# Patient Record
Sex: Female | Born: 2000 | Race: White | Hispanic: No | Marital: Single | Smoking: Never smoker
Health system: Southern US, Community
[De-identification: ages and names within clinical notes are randomized; demographics above are authoritative.]

## PROBLEM LIST (undated history)

## (undated) DIAGNOSIS — S43006A Unspecified dislocation of unspecified shoulder joint, initial encounter: Secondary | ICD-10-CM

## (undated) DIAGNOSIS — S52502A Unspecified fracture of the lower end of left radius, initial encounter for closed fracture: Secondary | ICD-10-CM

## (undated) DIAGNOSIS — M24112 Other articular cartilage disorders, left shoulder: Secondary | ICD-10-CM

---
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
## 2023-07-29 IMAGING — CT CT RENAL STONE PROTOCOL
2 of 4 series · 15 of 46 positions shown, 17 images · non-contrast
Comparison: None.

CLINICAL DATA: Flank pain, kidney stone suspected. Sudden onset low
back pain. Laterality is not indicated.

EXAM:
CT ABDOMEN AND PELVIS WITHOUT CONTRAST
TECHNIQUE: Multidetector CT imaging of the abdomen and pelvis was performed
following the standard protocol without IV contrast.

[Series 2: axial st · axial · 0.75mm/px · z∈[-240,+165]mm · 12 of 97 slices shown, 14 images]
[im 8/97  soft-tissue]
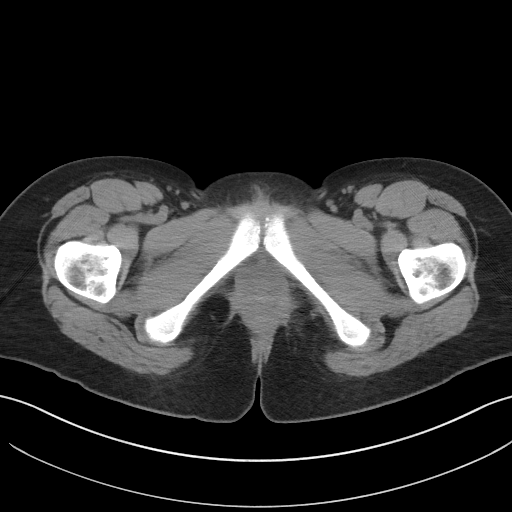
[im 8/97  bone]
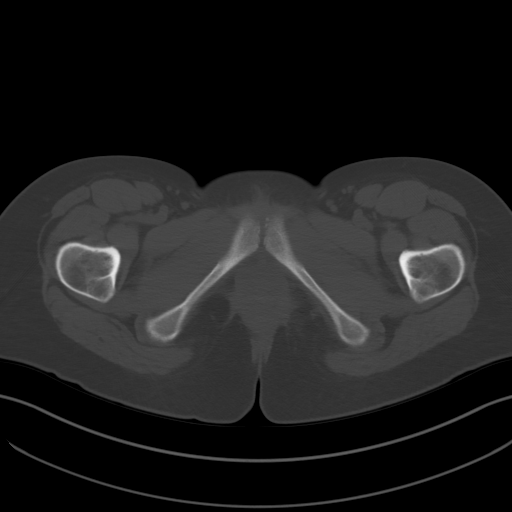
[im 15/97  soft-tissue]
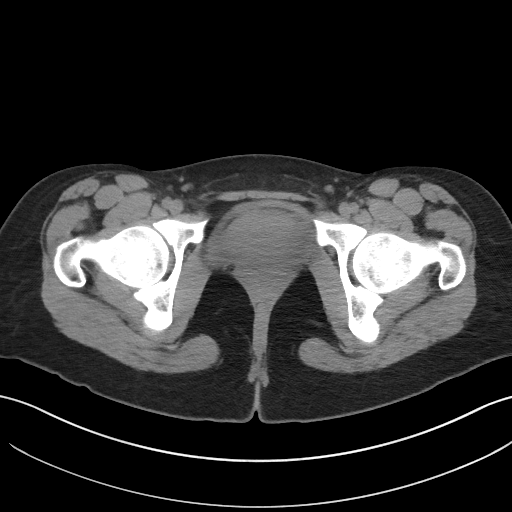
[im 23/97  soft-tissue]
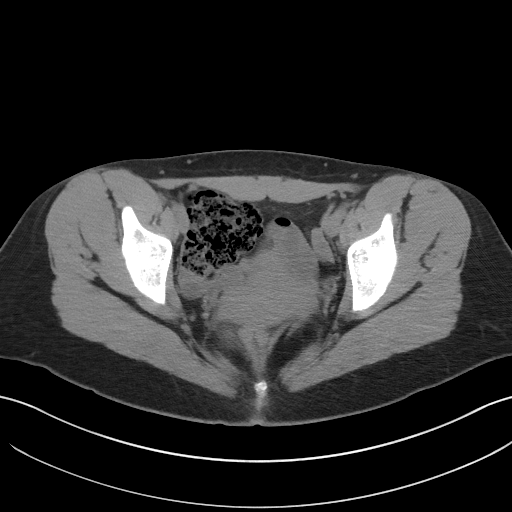
[im 30/97  soft-tissue]
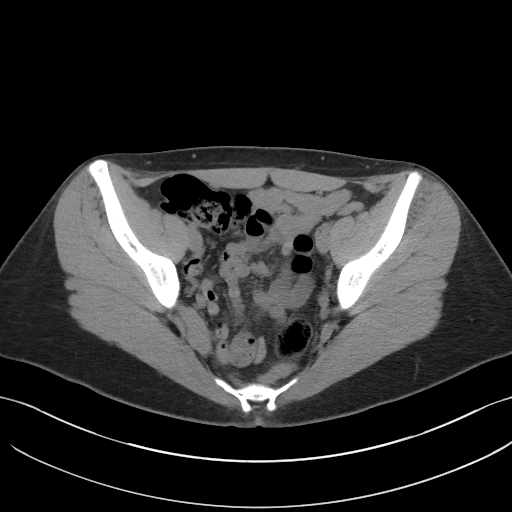
[im 37/97  soft-tissue]
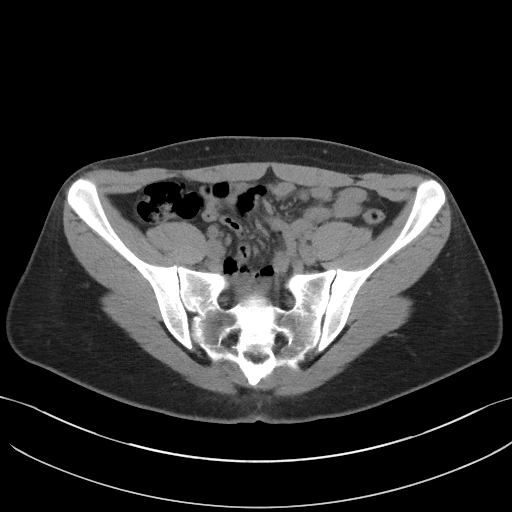
[im 45/97  soft-tissue]
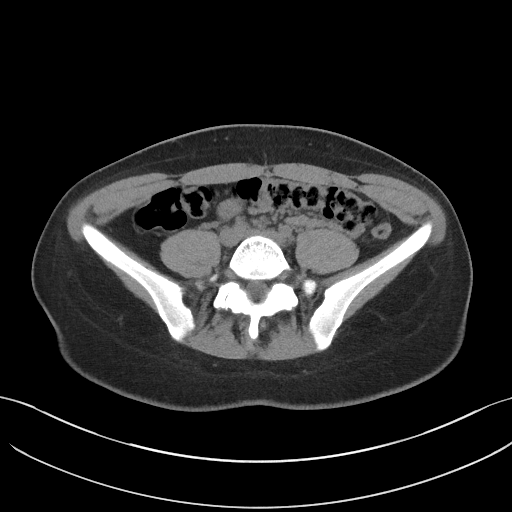
[im 52/97  soft-tissue]
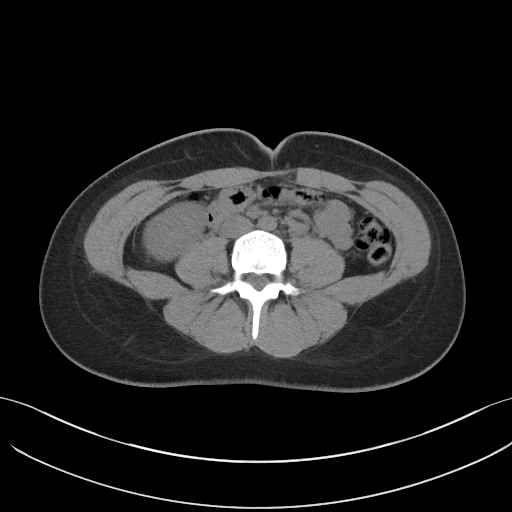
[im 60/97  soft-tissue]
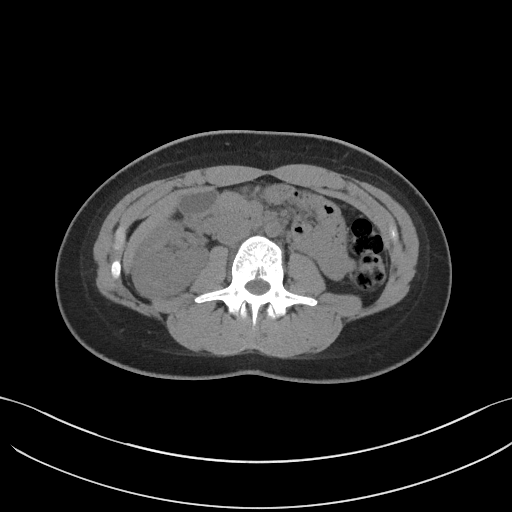
[im 67/97  soft-tissue]
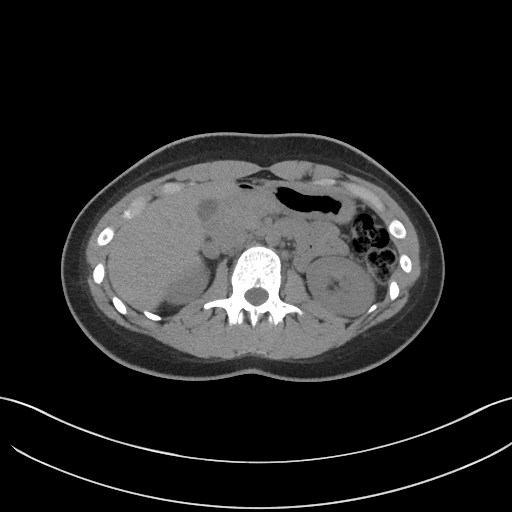
[im 67/97  bone]
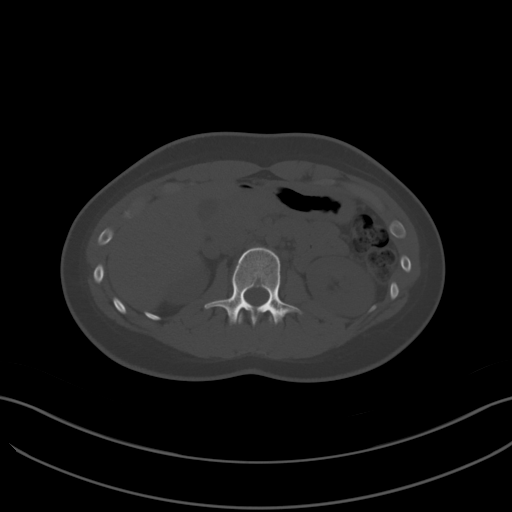
[im 74/97  soft-tissue]
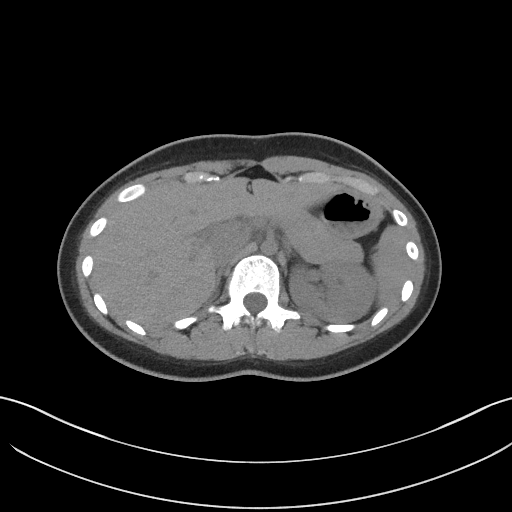
[im 82/97  soft-tissue]
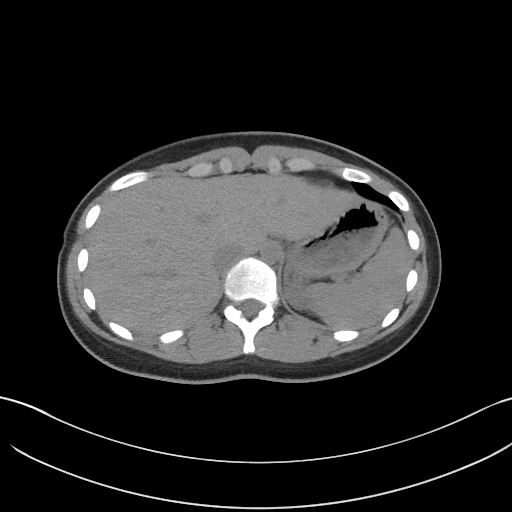
[im 89/97  soft-tissue]
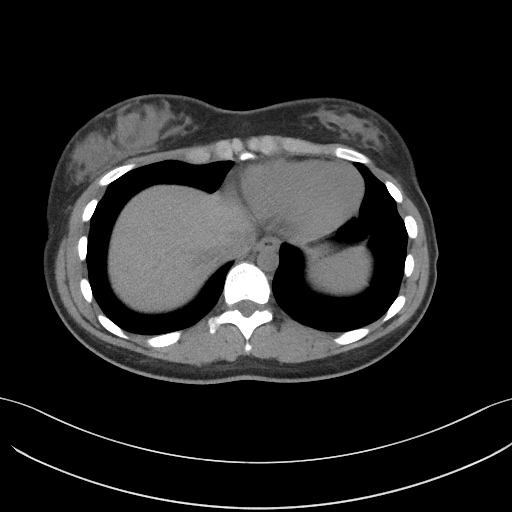

[Series 5: coronal st · coronal · 0.64mm/px · 3 of 87 slices shown]
[im 29/87  soft-tissue]
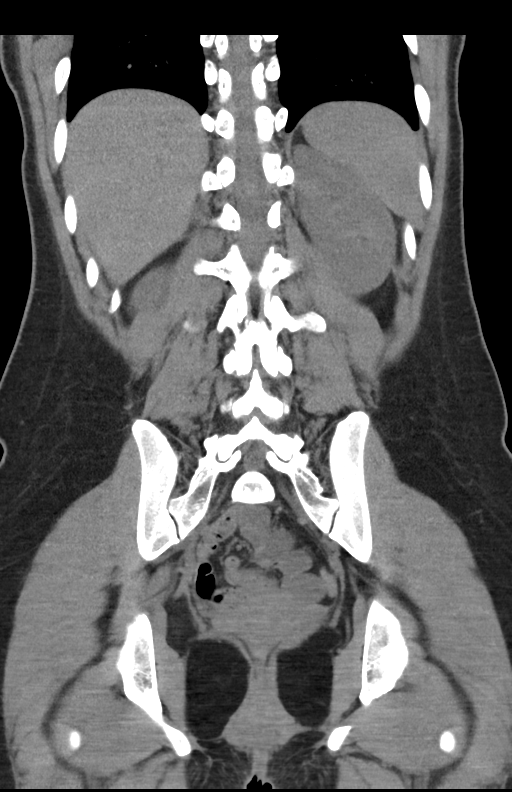
[im 39/87  soft-tissue]
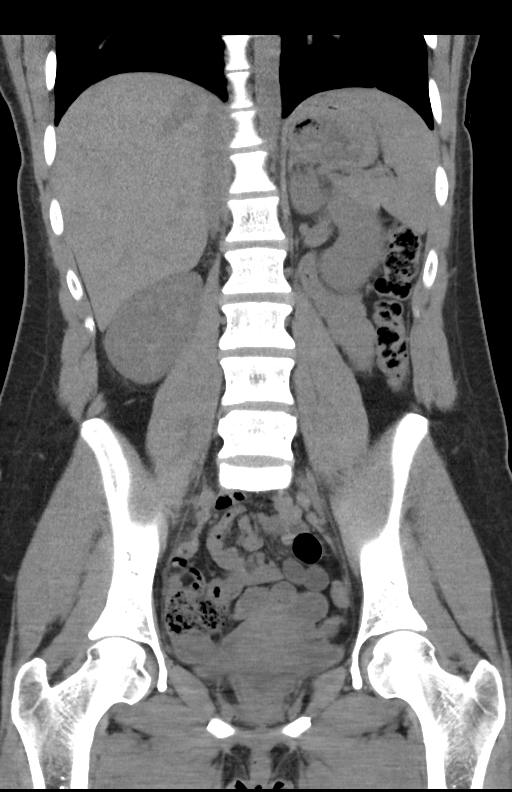
[im 48/87  soft-tissue]
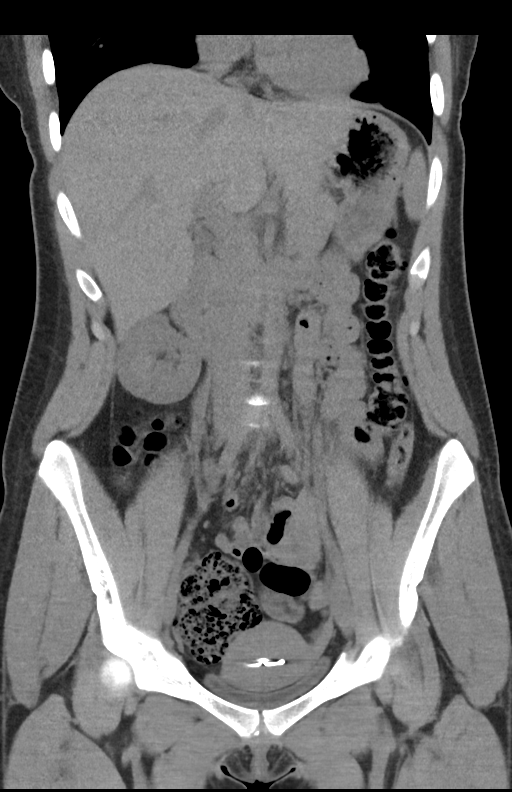

[15 of 46 positions shown; findings below may reference images not displayed]

FINDINGS: Lower chest: Lung bases are clear.

Hepatobiliary: No focal liver abnormality is seen. No gallstones,
gallbladder wall thickening, or biliary dilatation.

Pancreas: Unremarkable. No pancreatic ductal dilatation or
surrounding inflammatory changes.

Spleen: Normal in size without focal abnormality.

Adrenals/Urinary Tract: Adrenal glands are unremarkable. Kidneys are
normal, without renal calculi, focal lesion, or hydronephrosis.
Bladder is decompressed.

Stomach/Bowel: Stomach is within normal limits. Appendix appears
normal. No evidence of bowel wall thickening, distention, or
inflammatory changes.

Vascular/Lymphatic: No significant vascular findings are present. No
enlarged abdominal or pelvic lymph nodes.

Reproductive: Uterus and ovaries are not enlarged. An intrauterine
device is present. No abnormal adnexal masses.

Other: No abdominal wall hernia or abnormality. No abdominopelvic
ascites.

Musculoskeletal: No acute or significant osseous findings.
IMPRESSION: No renal or ureteral stone or obstruction. No acute abnormalities
demonstrated in the abdomen or pelvis.

## 2024-11-08 NOTE — Telephone Encounter (Signed)
 Macrobid 

## 2024-11-14 ENCOUNTER — Emergency Department (HOSPITAL_BASED_OUTPATIENT_CLINIC_OR_DEPARTMENT_OTHER)
Admission: EM | Admit: 2024-11-14 | Discharge: 2024-11-14 | Disposition: A | Discharge status: Home / Self Care | Payer: Self-pay | Attending: Emergency Medicine | Admitting: Emergency Medicine

## 2024-11-14 ENCOUNTER — Emergency Department (HOSPITAL_BASED_OUTPATIENT_CLINIC_OR_DEPARTMENT_OTHER): Payer: Self-pay

## 2024-11-14 ENCOUNTER — Other Ambulatory Visit: Payer: Self-pay

## 2024-11-14 ENCOUNTER — Encounter (HOSPITAL_BASED_OUTPATIENT_CLINIC_OR_DEPARTMENT_OTHER): Payer: Self-pay

## 2024-11-14 DIAGNOSIS — N12 Tubulo-interstitial nephritis, not specified as acute or chronic: Secondary | ICD-10-CM | POA: Insufficient documentation

## 2024-11-14 DIAGNOSIS — J111 Influenza due to unidentified influenza virus with other respiratory manifestations: Secondary | ICD-10-CM

## 2024-11-14 DIAGNOSIS — J101 Influenza due to other identified influenza virus with other respiratory manifestations: Secondary | ICD-10-CM | POA: Insufficient documentation

## 2024-11-14 DIAGNOSIS — R112 Nausea with vomiting, unspecified: Secondary | ICD-10-CM

## 2024-11-14 LAB — URINE DRUG SCREEN
Amphetamines: NEGATIVE
Barbiturates: NEGATIVE
Benzodiazepines: NEGATIVE
Cocaine: NEGATIVE
Fentanyl: NEGATIVE
Methadone Scn, Ur: NEGATIVE
Opiates: NEGATIVE
Tetrahydrocannabinol: NEGATIVE

## 2024-11-14 LAB — COMPREHENSIVE METABOLIC PANEL WITH GFR
ALT: 22 U/L (ref 0–44)
AST: 28 U/L (ref 15–41)
Albumin: 4.8 g/dL (ref 3.5–5.0)
Alkaline Phosphatase: 75 U/L (ref 38–126)
Anion gap: 15 (ref 5–15)
BUN: 12 mg/dL (ref 6–20)
CO2: 21 mmol/L — ABNORMAL LOW (ref 22–32)
Calcium: 9.2 mg/dL (ref 8.9–10.3)
Chloride: 102 mmol/L (ref 98–111)
Creatinine, Ser: 0.83 mg/dL (ref 0.44–1.00)
GFR, Estimated: >60 mL/min
Glucose, Bld: 95 mg/dL (ref 70–99)
Potassium: 3.8 mmol/L (ref 3.5–5.1)
Sodium: 139 mmol/L (ref 135–145)
Total Bilirubin: 1.1 mg/dL (ref 0.0–1.2)
Total Protein: 7.5 g/dL (ref 6.5–8.1)

## 2024-11-14 LAB — CBC WITH DIFFERENTIAL/PLATELET
Abs Immature Granulocytes: 0.02 K/uL (ref 0.00–0.07)
Basophils Absolute: 0 K/uL (ref 0.0–0.1)
Basophils Relative: 0 %
Eosinophils Absolute: 0 K/uL (ref 0.0–0.5)
Eosinophils Relative: 0 %
HCT: 45.8 % (ref 36.0–46.0)
Hemoglobin: 15.3 g/dL — ABNORMAL HIGH (ref 12.0–15.0)
Immature Granulocytes: 0 %
Lymphocytes Relative: 8 %
Lymphs Abs: 0.7 K/uL (ref 0.7–4.0)
MCH: 30.6 pg (ref 26.0–34.0)
MCHC: 33.4 g/dL (ref 30.0–36.0)
MCV: 91.6 fL (ref 80.0–100.0)
Monocytes Absolute: 1.1 K/uL — ABNORMAL HIGH (ref 0.1–1.0)
Monocytes Relative: 13 %
Neutro Abs: 6.4 K/uL (ref 1.7–7.7)
Neutrophils Relative %: 79 %
Platelets: 223 K/uL (ref 150–400)
RBC: 5 MIL/uL (ref 3.87–5.11)
RDW: 12.6 % (ref 11.5–15.5)
WBC: 8.1 K/uL (ref 4.0–10.5)
nRBC: 0 % (ref 0.0–0.2)

## 2024-11-14 LAB — URINALYSIS, ROUTINE W REFLEX MICROSCOPIC
Glucose, UA: NEGATIVE mg/dL
Ketones, ur: >=80 mg/dL — AB
Nitrite: NEGATIVE
Protein, ur: 30 mg/dL — AB
Specific Gravity, Urine: 1.025 (ref 1.005–1.030)
pH: 5.5 (ref 5.0–8.0)

## 2024-11-14 LAB — URINALYSIS, MICROSCOPIC (REFLEX)

## 2024-11-14 LAB — RESP PANEL BY RT-PCR (RSV, FLU A&B, COVID)  RVPGX2
Influenza A by PCR: POSITIVE — AB
Influenza B by PCR: NEGATIVE
Resp Syncytial Virus by PCR: NEGATIVE
SARS Coronavirus 2 by RT PCR: NEGATIVE

## 2024-11-14 LAB — PREGNANCY, URINE: Preg Test, Ur: NEGATIVE

## 2024-11-14 LAB — LIPASE, BLOOD: Lipase: 20 U/L (ref 11–51)

## 2024-11-14 MED ORDER — IOHEXOL 300 MG/ML  SOLN
100.0000 mL | Freq: Once | INTRAMUSCULAR | Status: AC | PRN
  Administered 2024-11-14: 100 mL via INTRAVENOUS

## 2024-11-14 MED ORDER — ACETAMINOPHEN 325 MG PO TABS
650.0000 mg | ORAL_TABLET | Freq: Once | ORAL | Status: AC
  Administered 2024-11-14: 650 mg via ORAL
  Filled 2024-11-14: qty 2

## 2024-11-14 MED ORDER — SODIUM CHLORIDE 0.9 % IV SOLN
1.0000 g | Freq: Once | INTRAVENOUS | Status: AC
  Administered 2024-11-14: 1 g via INTRAVENOUS
  Filled 2024-11-14: qty 10

## 2024-11-14 MED ORDER — SODIUM CHLORIDE 0.9 % IV BOLUS
1000.0000 mL | Freq: Once | INTRAVENOUS | Status: AC
  Administered 2024-11-14: 1000 mL via INTRAVENOUS

## 2024-11-14 MED ORDER — MORPHINE SULFATE (PF) 4 MG/ML IV SOLN
4.0000 mg | Freq: Once | INTRAVENOUS | Status: AC
  Administered 2024-11-14: 4 mg via INTRAVENOUS
  Filled 2024-11-14: qty 1

## 2024-11-14 MED ORDER — CEFDINIR 300 MG PO CAPS: 300.0000 mg | ORAL_CAPSULE | Freq: Two times a day (BID) | ORAL | 0 refills | Status: AC

## 2024-11-14 MED ORDER — ALUM & MAG HYDROXIDE-SIMETH 200-200-20 MG/5ML PO SUSP
30.0000 mL | Freq: Once | ORAL | Status: AC
  Administered 2024-11-14: 30 mL via ORAL
  Filled 2024-11-14: qty 30

## 2024-11-14 MED ORDER — ONDANSETRON HCL 4 MG PO TABS: 4.0000 mg | ORAL_TABLET | ORAL | 0 refills | Status: AC | PRN

## 2024-11-14 MED ORDER — ONDANSETRON HCL 4 MG/2ML IJ SOLN
4.0000 mg | Freq: Once | INTRAMUSCULAR | Status: AC
  Administered 2024-11-14: 4 mg via INTRAVENOUS
  Filled 2024-11-14: qty 2

## 2024-11-14 MED ORDER — ONDANSETRON 4 MG PO TBDP
4.0000 mg | ORAL_TABLET | Freq: Once | ORAL | Status: DC | PRN
  Filled 2024-11-14: qty 1

## 2024-11-14 MED ORDER — LIDOCAINE VISCOUS HCL 2 % MT SOLN
15.0000 mL | Freq: Once | OROMUCOSAL | Status: AC
  Administered 2024-11-14: 15 mL via ORAL
  Filled 2024-11-14: qty 15

## 2024-11-14 NOTE — ED Notes (Signed)
 Pt provided apple juice, graham crackers for po challenge.

## 2024-11-14 NOTE — ED Provider Notes (Signed)
 " [facility] Provider Note  CSN: [id number] Arrival date & time: 11/14/24 9260  Chief Complaint(s) Emesis  HPI Amber Anderson is a 24 y.o. female with past medical history as below, significant for prior shoulder arthroscopy and I&D of shoulder, IUD who presents to the ED with complaint of nausea, vomiting abdominal pain  Patient reports for around the last month's been having intermittent nausea and vomiting, epigastric discomfort.  She had her IUD replaced around 3 weeks ago.  At that time she was having some urinary concerns and was started on Macrobid.  Urinary symptoms improved.  She was also having some ongoing nausea and vomiting that time and some intermittent abdominal pain.  Her symptoms returned over the past 3 to 4 days, abdominal pain, nausea, vomiting, fever.  No significant change in bowel or bladder function.  No abnormal vaginal bleeding or discharge.  No chest pain or dyspnea.P.o. intake or recent travel.  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  Home Medication(s) Prior to Admission medications  Medication Sig Start Date End Date Taking? Authorizing Provider  cefdinir  (OMNICEF ) 300 MG capsule Take 1 capsule (300 mg total) by mouth 2 (two) times daily for 10 days. 11/14/24 11/24/24 Yes Elnor Jayson LABOR, DO  ondansetron  (ZOFRAN ) 4 MG tablet Take 1 tablet (4 mg total) by mouth every 4 (four) hours as needed for nausea or vomiting. 11/14/24  Yes Elnor Jayson A, DO  ciprofloxacin  (CIPRO ) 500 MG tablet Take 1 tablet (500 mg total) by mouth 2 (two) times daily. One po bid x 7 days 07/21/21   Geroldine Berg, MD  HYDROcodone -acetaminophen  (NORCO) 5-325 MG tablet Take 2 tablets by mouth every 4 (four) hours  as needed. 07/21/21   Geroldine Berg, MD                                                                                                                                    Past Surgical History Past Surgical History:  Procedure Laterality Date   IRRIGATION AND DEBRIDEMENT SHOULDER Left 08/06/2017   Procedure: SHOULDER DEBRIDEMENT EXTENSIVE;  Surgeon: Beverley Evalene BIRCH, MD;  Location: [facility];  Service: Orthopedics;  Laterality: Left;   SHOULDER ARTHROSCOPY WITH CAPSULORRHAPHY Left 08/06/2017   Procedure: SHOULDER ATHROSCOPY WITH CAPSULORRHAPHY;  Surgeon: Beverley Evalene BIRCH, MD;  Location: [facility];  Service: Orthopedics;  Laterality: Left;   Family History Family History  Problem Relation Age of Onset   Hypertension Maternal Grandmother    Asthma Maternal Grandmother    Stroke Maternal Grandmother    Heart disease Maternal Grandfather        MI    Social History  Social History[1] Allergies Patient has no known allergies.  Review of Systems A thorough review of systems was obtained and all systems are negative except as noted in the HPI and PMH.   Physical Exam Vital Signs  I have reviewed the triage vital signs BP 118/77   Pulse 98   Temp (!) 100.8 F (38.2 C) (Oral)   Resp (!) 30   Ht 5' 7 (1.702 m)   Wt 74.8 kg   SpO2 99%   BMI 25.84 kg/m  Physical Exam Vitals and nursing note reviewed.  Constitutional:      General: She is not in acute distress.    Appearance: Normal appearance. She is well-developed. She is not ill-appearing.  HENT:     Head: Normocephalic and atraumatic.     Right Ear: External ear normal.     Left Ear: External ear normal.     Nose: Nose normal.     Mouth/Throat:     Mouth: Mucous membranes are moist.  Eyes:     General: No scleral icterus.       Right eye: No discharge.        Left eye: No discharge.  Cardiovascular:     Rate and Rhythm: Tachycardia present.  Pulmonary:     Effort: Pulmonary effort is  normal. No respiratory distress.     Breath sounds: No stridor.  Abdominal:     General: Abdomen is flat. There is no distension.     Palpations: Abdomen is soft.     Tenderness: There is generalized abdominal tenderness and tenderness in the epigastric area. There is no guarding.  Musculoskeletal:        General: No deformity.     Cervical back: No rigidity.  Skin:    General: Skin is warm and dry.     Coloration: Skin is not cyanotic, jaundiced or pale.  Neurological:     Mental Status: She is alert.  Psychiatric:        Speech: Speech normal.        Behavior: Behavior normal. Behavior is cooperative.     ED Results and Treatments Labs (all labs ordered are listed, but only abnormal results are displayed) Labs Reviewed  RESP PANEL BY RT-PCR (RSV, FLU A&B, COVID)  RVPGX2 - Abnormal; Notable for the following components:      Result Value   Influenza A by PCR POSITIVE (*)    All other components within normal limits  COMPREHENSIVE METABOLIC PANEL WITH GFR - Abnormal; Notable for the following components:   CO2 21 (*)    All other components within normal limits  URINALYSIS, ROUTINE W REFLEX MICROSCOPIC - Abnormal; Notable for the following components:   Hgb urine dipstick TRACE (*)    Bilirubin Urine SMALL (*)    Ketones, ur >=80 (*)    Protein, ur 30 (*)    Leukocytes,Ua TRACE (*)    All other components within normal limits  CBC WITH DIFFERENTIAL/PLATELET - Abnormal; Notable for the following components:   Hemoglobin 15.3 (*)    Monocytes Absolute 1.1 (*)    All other components within normal limits  URINALYSIS, MICROSCOPIC (REFLEX) - Abnormal; Notable for the following components:   Bacteria, UA MANY (*)    All other components within normal limits  URINE CULTURE  LIPASE, BLOOD  PREGNANCY, URINE  URINE DRUG SCREEN  Radiology CT ABDOMEN PELVIS W  CONTRAST Result Date: 11/14/2024 CLINICAL DATA:  Fever, vomiting and dysuria. EXAM: CT ABDOMEN AND PELVIS WITH CONTRAST TECHNIQUE: Multidetector CT imaging of the abdomen and pelvis was performed using the standard protocol following bolus administration of intravenous contrast. RADIATION DOSE REDUCTION: This exam was performed according to the departmental dose-optimization program which includes automated exposure control, adjustment of the mA and/or kV according to patient size and/or use of iterative reconstruction technique. CONTRAST:  OMNIPAQUE  IOHEXOL  300 MG/ML  SOLN COMPARISON:  [facility] CT abdomen pelvis [facility] 12/07/2022 FINDINGS: Lower chest: No acute abnormality. Hepatobiliary: No focal liver abnormality is seen. No gallstones, gallbladder wall thickening, or biliary dilatation. Pancreas: Unremarkable. No pancreatic ductal dilatation or surrounding inflammatory changes. Spleen: No splenic injury or perisplenic hematoma. Adrenals/Urinary Tract: Normal adrenal glands. No hydronephrosis or renal calculi bilaterally. Lower pole anterior cyst of the left kidney measures up to approximately 10 mm and shows some enlargement since 2024. Bladder is decompressed. Stomach/Bowel: Bowel shows no evidence of obstruction, ileus, inflammation or lesion. The appendix is normal. No free intraperitoneal air. Vascular/Lymphatic: No significant vascular findings are present. No enlarged abdominal or pelvic lymph nodes. Reproductive: Uterus and bilateral adnexa are unremarkable. IUD is normally positioned in the endometrial cavity. Other: No abdominal wall hernia or abnormality. No abdominopelvic ascites. Musculoskeletal: No acute or significant osseous findings. IMPRESSION: 1. No acute findings in the abdomen or pelvis. 2. 10 mm lower pole left renal cyst shows some enlargement since 2024. This is likely a benign cyst. Electronically Signed   By: Marcey Moan M.D.   On: 11/14/2024 10:59    Pertinent  labs & imaging results that were available during my care of the patient were reviewed by me and considered in my medical decision making (see MDM for details).  Medications Ordered in ED Medications  sodium chloride  0.9 % bolus 1,000 mL (0 mLs Intravenous Stopped 11/14/24 1026)  alum & mag hydroxide-simeth (MAALOX/MYLANTA) 200-200-20 MG/5ML suspension 30 mL (30 mLs Oral Given 11/14/24 0938)    And  lidocaine  (XYLOCAINE ) 2 % viscous mouth solution 15 mL (15 mLs Oral Given 11/14/24 0938)  ondansetron  (ZOFRAN ) injection 4 mg (4 mg Intravenous Given 11/14/24 0938)  morphine  (PF) 4 MG/ML injection 4 mg (4 mg Intravenous Given 11/14/24 0938)  acetaminophen  (TYLENOL ) tablet 650 mg (650 mg Oral Given 11/14/24 0938)  iohexol  (OMNIPAQUE ) 300 MG/ML solution 100 mL (100 mLs Intravenous Contrast Given 11/14/24 1027)  cefTRIAXone  (ROCEPHIN ) 1 g in sodium chloride  0.9 % 100 mL IVPB (0 g Intravenous Stopped 11/14/24 1128)                                                                                                                                     Procedures Procedures  (including critical care time)  Medical Decision Making / ED Course    Medical Decision Making:    Alissah Redmon is a 24  y.o. female with past medical history as below, significant for prior shoulder arthroscopy and I&D of shoulder, IUD who presents to the ED with complaint of nausea, vomiting abdominal pain. The complaint involves an extensive differential diagnosis and also carries with it a high risk of complications and morbidity.  Serious etiology was considered. Ddx includes but is not limited to: Differential diagnosis includes but is not exclusive to acute cholecystitis, intrathoracic causes for epigastric abdominal pain, gastritis, duodenitis, pancreatitis, small bowel or large bowel obstruction, abdominal aortic aneurysm, hernia, gastritis, etc.   Complete initial physical exam performed, notably the patient was in no acute  distress, tachycardia and fever noted Reviewed and confirmed nursing documentation for past medical history, family history, social history.  Vital signs reviewed.    Abdominal pain Nausea and vomiting Recent UTI> - Patient recent UTI, completed Macrobid and was given a second round of Macrobid that she started taking couple days ago.  Abdominal pain, nausea vomiting.  No diarrhea. - Presumed pyelonephritis, was given macrobid at OSH, given Rocephin  here.  Give antibiotics for home.  Antiemetic. - She is well-appearing, tolerating p.o., feeling much better, does not appear to require admission at this time.  Influenza> - Outside treatment window for antiviral.  Recommend supportive care at home, close follow-up PCP. No hypoxia  Clinical Course as of 11/14/24 1314  Tue Nov 14, 2024  1038 Influenza A By PCR(!): POSITIVE [SG]  1038 Ua w/ gross contamination, will treat for UTI given her symptoms  [SG]  1223 Reports she is feeling better, she is able to tolerate p.o. without difficulty.  Urinalysis with contamination but she is having some dysuria, recent UTI treated with Macrobid.  Concern for pyelonephritis.  Given Rocephin  here.  Fluids.  Give antiemetic for home, antibiotics.  Follow-up PCP  Also noted to have the flu, symptoms ongoing for 72 hours or so, not candidate for tamiflu [SG]    Clinical Course User Index [SG] Elnor Jayson LABOR, DO   She is feeling much better, ambulatory, tolerating p.o., HDS.  Stable for discharge  1:14 PM:  I have discussed the diagnosis/risks/treatment options with the patient and spouse.  Evaluation and diagnostic testing in the emergency department does not suggest an emergent condition requiring admission or immediate intervention beyond what has been performed at this time.  They will follow up with pcp. We also discussed returning to the ED immediately if new or worsening sx occur. We discussed the sx which are most concerning (e.g., sudden worsening  pain, fever, inability to tolerate by mouth) that necessitate immediate return.    The patient appears reasonably screened and/or stabilized for discharge and I doubt any other medical condition or other [facility] requiring further screening, evaluation, or treatment in the ED at this time prior to discharge.                 Additional history obtained: -Additional history obtained from family -External records from outside source obtained and reviewed including: Chart review including previous notes, labs, imaging, consultation notes including  Recent OB/GYN documentation   Lab Tests: -I ordered, reviewed, and interpreted labs.   The pertinent results include:   Labs Reviewed  RESP PANEL BY RT-PCR (RSV, FLU A&B, COVID)  RVPGX2 - Abnormal; Notable for the following components:      Result Value   Influenza A by PCR POSITIVE (*)    All other components within normal limits  COMPREHENSIVE METABOLIC PANEL WITH GFR - Abnormal; Notable for the following components:  CO2 21 (*)    All other components within normal limits  URINALYSIS, ROUTINE W REFLEX MICROSCOPIC - Abnormal; Notable for the following components:   Hgb urine dipstick TRACE (*)    Bilirubin Urine SMALL (*)    Ketones, ur >=80 (*)    Protein, ur 30 (*)    Leukocytes,Ua TRACE (*)    All other components within normal limits  CBC WITH DIFFERENTIAL/PLATELET - Abnormal; Notable for the following components:   Hemoglobin 15.3 (*)    Monocytes Absolute 1.1 (*)    All other components within normal limits  URINALYSIS, MICROSCOPIC (REFLEX) - Abnormal; Notable for the following components:   Bacteria, UA MANY (*)    All other components within normal limits  URINE CULTURE  LIPASE, BLOOD  PREGNANCY, URINE  URINE DRUG SCREEN    Notable for urine with ketonuria and possible infection.  Influenza, bicarb is mildly reduced.  EKG   EKG Interpretation Date/Time:  Tuesday November 14 2024 09:45:04 EST Ventricular Rate:   98 PR Interval:  149 QRS Duration:  78 QT Interval:  336 QTC Calculation: 429 R Axis:   91  Text Interpretation: Sinus rhythm Borderline right axis deviation Confirmed by Elnor Savant ([id number]) on 11/14/2024 11:24:12 AM         Imaging Studies ordered: I ordered imaging studies including CT abdomen pelvis I independently visualized the following imaging with scope of interpretation limited to determining acute life threatening conditions related to emergency care; findings noted above I agree with the radiologist interpretation If any imaging was obtained with contrast I closely monitored patient for any possible adverse reaction a/w contrast administration in the emergency department   Medicines ordered and prescription drug management: Meds ordered this encounter  Medications   sodium chloride  0.9 % bolus 1,000 mL   DISCONTD: ondansetron  (ZOFRAN -ODT) disintegrating tablet 4 mg   AND Linked Order Group    alum & mag hydroxide-simeth (MAALOX/MYLANTA) 200-200-20 MG/5ML suspension 30 mL    lidocaine  (XYLOCAINE ) 2 % viscous mouth solution 15 mL   ondansetron  (ZOFRAN ) injection 4 mg   morphine  (PF) 4 MG/ML injection 4 mg   acetaminophen  (TYLENOL ) tablet 650 mg   iohexol  (OMNIPAQUE ) 300 MG/ML solution 100 mL   cefTRIAXone  (ROCEPHIN ) 1 g in sodium chloride  0.9 % 100 mL IVPB    Antibiotic Indication::   UTI   cefdinir  (OMNICEF ) 300 MG capsule    Sig: Take 1 capsule (300 mg total) by mouth 2 (two) times daily for 10 days.    Dispense:  20 capsule    Refill:  0   ondansetron  (ZOFRAN ) 4 MG tablet    Sig: Take 1 tablet (4 mg total) by mouth every 4 (four) hours as needed for nausea or vomiting.    Dispense:  12 tablet    Refill:  0    -I have reviewed the patients home medicines and have made adjustments as needed   Consultations Obtained: Not applicable  Cardiac Monitoring: Continuous pulse oximetry interpreted by myself, 99% on room air.    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    Final Clinical Impression(s) / ED Diagnoses Final diagnoses:  Influenza  Pyelonephritis  Nausea and vomiting, unspecified vomiting type         [1]  Social History Tobacco Use   Smoking status: Never   Smokeless tobacco: Never  Vaping Use   Vaping status: Never Used  Substance Use Topics   Alcohol use: No   Drug use: No     Elnor Jayson LABOR, DO 11/14/24 1314  "

## 2024-11-14 NOTE — ED Notes (Signed)
Lab notified to add-on urine culture to previously collected urine sample.  

## 2024-11-14 NOTE — ED Notes (Signed)
 Pt transported to imaging.

## 2024-11-14 NOTE — Discharge Instructions (Addendum)
 It was a pleasure caring for you today in the emergency department.  Please drink plenty of clear fluids (water, Gatorade, chicken broth, etc).  You may use Tylenol  and/or Motrin  according to label instructions.  You can alternate between the two without any side effects.  You can take Zofran  for any nausea.  Symptoms should improve over the next 24 hours with the antibiotics.  Please follow up with your doctor as listed above.  Call your doctor or return to the Emergency Department (ED) if you are unable to tolerate fluids due to vomiting, have worsening trouble breathing, become extremely tired or difficult to awaken, or if you develop any other symptoms that concern you.

## 2024-11-14 NOTE — ED Triage Notes (Signed)
 Reports fever, emesis for 3 days. States 3 weeks ago had similar symptoms, took abx from PCP, some relief but symptoms returned  Dysuria  Also states got IUD changed 3 weeks ago

## 2024-11-14 NOTE — ED Notes (Signed)
D/c paperwork reviewed with pt, including prescriptions and follow up care.  No questions or concerns voiced at time of d/c. . Pt verbalized understanding, Ambulatory without assistance to ED exit, NAD.   

## 2024-11-15 LAB — URINE CULTURE: Culture: NO GROWTH
